# Patient Record
Sex: Male | Born: 1980 | Race: Black or African American | Hispanic: No | Marital: Single
Health system: Southern US, Community
[De-identification: ages and names within clinical notes are randomized; demographics above are authoritative.]

---

## 2000-11-01 ENCOUNTER — Emergency Department (HOSPITAL_COMMUNITY): Admission: EM | Admit: 2000-11-01 | Discharge: 2000-11-01 | Payer: Self-pay | Admitting: Emergency Medicine

## 2008-08-06 ENCOUNTER — Emergency Department (HOSPITAL_COMMUNITY): Admission: EM | Admit: 2008-08-06 | Discharge: 2008-08-06 | Payer: Self-pay | Admitting: Emergency Medicine

## 2010-04-23 IMAGING — CR DG CHEST 1V PORT
1 series · 1 of 1 positions shown · non-contrast
Comparison: None

CLINICAL DATA: Shortness of breath.

PORTABLE CHEST - 1 VIEW

[view not recorded]
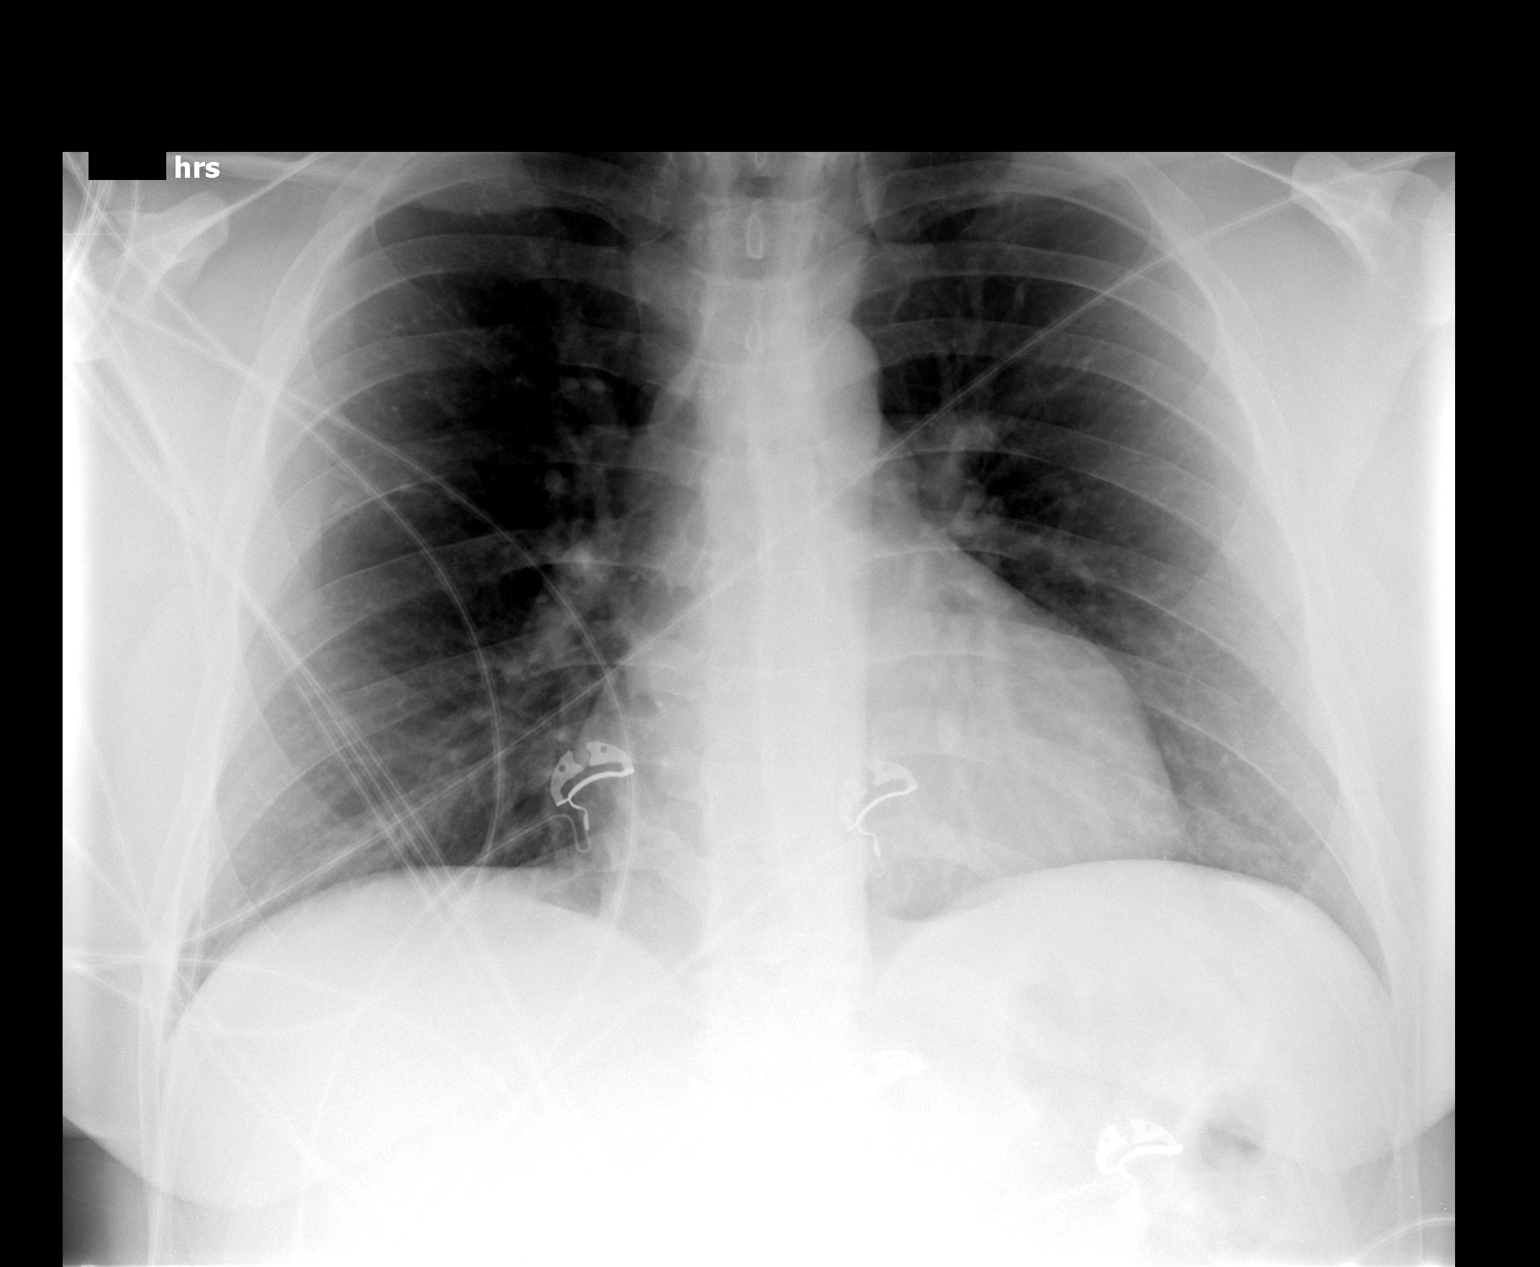

[1 of 1 positions shown; findings below may reference images not displayed]

FINDINGS: The heart size and vascularity are normal and the lungs
are clear.  No bony abnormality.
IMPRESSION: Normal chest.

## 2010-10-02 LAB — DIFFERENTIAL
Eosinophils Absolute: 0 10*3/uL (ref 0.0–0.7)
Eosinophils Relative: 0 % (ref 0–5)
Lymphocytes Relative: 19 % (ref 12–46)
Lymphs Abs: 1.1 10*3/uL (ref 0.7–4.0)
Monocytes Relative: 8 % (ref 3–12)

## 2010-10-02 LAB — POCT CARDIAC MARKERS
CKMB, poc: <1 ng/mL — ABNORMAL LOW (ref 1.0–8.0)
CKMB, poc: <1 ng/mL — ABNORMAL LOW (ref 1.0–8.0)
Troponin i, poc: <0.05 ng/mL (ref 0.00–0.09)
Troponin i, poc: <0.05 ng/mL (ref 0.00–0.09)

## 2010-10-02 LAB — CBC
MCHC: 33.7 g/dL (ref 30.0–36.0)
MCV: 86.3 fL (ref 78.0–100.0)
Platelets: 227 10*3/uL (ref 150–400)
RDW: 13.6 % (ref 11.5–15.5)

## 2010-10-02 LAB — COMPREHENSIVE METABOLIC PANEL
ALT: 54 U/L — ABNORMAL HIGH (ref 0–53)
AST: 29 U/L (ref 0–37)
Calcium: 9.1 mg/dL (ref 8.4–10.5)
Creatinine, Ser: 1.12 mg/dL (ref 0.4–1.5)
GFR calc Af Amer: >60 mL/min (ref >60)
Sodium: 140 mEq/L (ref 135–145)
Total Protein: 7.3 g/dL (ref 6.0–8.3)

## 2010-10-02 LAB — D-DIMER, QUANTITATIVE: D-Dimer, Quant: 0.24 ug/mL-FEU (ref 0.00–0.48)

## 2017-03-18 DIAGNOSIS — R509 Fever, unspecified: Secondary | ICD-10-CM | POA: Diagnosis not present

## 2017-04-02 DIAGNOSIS — Z23 Encounter for immunization: Secondary | ICD-10-CM | POA: Diagnosis not present

## 2019-07-01 DIAGNOSIS — Z20828 Contact with and (suspected) exposure to other viral communicable diseases: Secondary | ICD-10-CM | POA: Diagnosis not present

## 2019-10-13 DIAGNOSIS — Z0000 Encounter for general adult medical examination without abnormal findings: Secondary | ICD-10-CM | POA: Diagnosis not present

## 2019-10-13 DIAGNOSIS — Z13220 Encounter for screening for lipoid disorders: Secondary | ICD-10-CM | POA: Diagnosis not present

## 2019-10-13 DIAGNOSIS — Z800 Family history of malignant neoplasm of digestive organs: Secondary | ICD-10-CM | POA: Diagnosis not present

## 2019-10-13 DIAGNOSIS — Z131 Encounter for screening for diabetes mellitus: Secondary | ICD-10-CM | POA: Diagnosis not present

## 2019-11-18 DIAGNOSIS — Z01818 Encounter for other preprocedural examination: Secondary | ICD-10-CM | POA: Diagnosis not present

## 2019-11-18 DIAGNOSIS — Z1211 Encounter for screening for malignant neoplasm of colon: Secondary | ICD-10-CM | POA: Diagnosis not present

## 2019-11-18 DIAGNOSIS — Z800 Family history of malignant neoplasm of digestive organs: Secondary | ICD-10-CM | POA: Diagnosis not present

## 2019-12-22 DIAGNOSIS — Z1159 Encounter for screening for other viral diseases: Secondary | ICD-10-CM | POA: Diagnosis not present

## 2019-12-27 DIAGNOSIS — Q438 Other specified congenital malformations of intestine: Secondary | ICD-10-CM | POA: Diagnosis not present

## 2019-12-27 DIAGNOSIS — K648 Other hemorrhoids: Secondary | ICD-10-CM | POA: Diagnosis not present

## 2019-12-27 DIAGNOSIS — K529 Noninfective gastroenteritis and colitis, unspecified: Secondary | ICD-10-CM | POA: Diagnosis not present

## 2019-12-27 DIAGNOSIS — Z1211 Encounter for screening for malignant neoplasm of colon: Secondary | ICD-10-CM | POA: Diagnosis not present

## 2019-12-27 DIAGNOSIS — Z800 Family history of malignant neoplasm of digestive organs: Secondary | ICD-10-CM | POA: Diagnosis not present

## 2019-12-27 DIAGNOSIS — K6389 Other specified diseases of intestine: Secondary | ICD-10-CM | POA: Diagnosis not present

## 2019-12-29 DIAGNOSIS — F418 Other specified anxiety disorders: Secondary | ICD-10-CM | POA: Diagnosis not present

## 2022-03-05 ENCOUNTER — Emergency Department (HOSPITAL_BASED_OUTPATIENT_CLINIC_OR_DEPARTMENT_OTHER)
Admission: EM | Admit: 2022-03-05 | Discharge: 2022-03-05 | Disposition: A | Discharge status: Home / Self Care | Payer: PRIVATE HEALTH INSURANCE | Attending: Emergency Medicine | Admitting: Emergency Medicine

## 2022-03-05 ENCOUNTER — Other Ambulatory Visit (HOSPITAL_BASED_OUTPATIENT_CLINIC_OR_DEPARTMENT_OTHER): Payer: Self-pay

## 2022-03-05 ENCOUNTER — Other Ambulatory Visit: Payer: Self-pay

## 2022-03-05 ENCOUNTER — Encounter (HOSPITAL_BASED_OUTPATIENT_CLINIC_OR_DEPARTMENT_OTHER): Payer: Self-pay

## 2022-03-05 DIAGNOSIS — W57XXXA Bitten or stung by nonvenomous insect and other nonvenomous arthropods, initial encounter: Secondary | ICD-10-CM | POA: Insufficient documentation

## 2022-03-05 DIAGNOSIS — T783XXA Angioneurotic edema, initial encounter: Secondary | ICD-10-CM | POA: Diagnosis present

## 2022-03-05 DIAGNOSIS — T63461A Toxic effect of venom of wasps, accidental (unintentional), initial encounter: Secondary | ICD-10-CM

## 2022-03-05 MED ORDER — EPINEPHRINE 0.3 MG/0.3ML IJ SOAJ
0.3000 mg | INTRAMUSCULAR | 0 refills | Status: AC | PRN
  Filled 2022-03-05 (×2): qty 2, 30d supply, fill #0

## 2022-03-05 MED ORDER — PREDNISONE 20 MG PO TABS
40.0000 mg | ORAL_TABLET | Freq: Every day | ORAL | 0 refills | Status: AC
  Filled 2022-03-05: qty 6, 3d supply, fill #0

## 2022-03-05 MED ORDER — DEXAMETHASONE SODIUM PHOSPHATE 10 MG/ML IJ SOLN
10.0000 mg | Freq: Once | INTRAMUSCULAR | Status: AC
  Administered 2022-03-05: 10 mg via INTRAVENOUS
  Filled 2022-03-05: qty 1

## 2022-03-05 MED ORDER — FAMOTIDINE IN NACL 20-0.9 MG/50ML-% IV SOLN
20.0000 mg | Freq: Once | INTRAVENOUS | Status: AC
  Administered 2022-03-05: 20 mg via INTRAVENOUS
  Filled 2022-03-05: qty 50

## 2022-03-05 NOTE — ED Provider Notes (Addendum)
Here with allergic reaction.  Signed out to me at 7 AM.  Bit by a yellow jacket on his nose and left jaw yesterday afternoon.  He is had swelling to his face around his eyes, left cheek.  No breathing issues or swelling of his tongue or lips.  Has been stung multiple times in the past with similar reactions but never anaphylactic reaction.  Appears very comfortable.  He has been given Decadron and famotidine with improvement.  Clear breath sounds.  Well-appearing.  Still some swelling around the eyes and the left side of his face but seems to be improving.  Despite no history of anaphylaxis did have shared decision with him about prescribing him an EpiPen and when and how to use them.  We will continue prednisone outpatient as well.  Understands return precautions.  Discharged in good condition.  Bee sting happened over 15 hours ago and unlikely that he needs any further acute treatment at this time.  He understands return precautions.  This chart was dictated using voice recognition software.  Despite best efforts to proofread,  errors can occur which can change the documentation meaning.    Lennice Sites, DO 03/05/22 [place], [place], DO 03/05/22 [phone]

## 2022-03-05 NOTE — ED Provider Notes (Signed)
[facility] Provider Note: Georgena Spurling, MD, FACEP  CSN: [id number] MRN: [id number] ARRIVAL: 03/05/22 at 0613 ROOM: DB012/DB012   CHIEF COMPLAINT  Facial Swelling   HISTORY OF PRESENT ILLNESS  03/05/22 6:23 AM Larry Sanchez is a 41 y.o. male who was stung on the face and neck by yellow jackets yesterday afternoon about 3:30 PM.  He had some localized discomfort at the sting sites yesterday but no significant swelling.  This morning about 1 AM he awakened with significant swelling of his face and the left side of his neck.  It is not painful.  It is not pruritic.  He is having no swelling of the tongue or throat.  He is having no difficulty breathing, speaking or swallowing.  He is having no nausea, vomiting or diarrhea.  He took 50 mg of Benadryl about 90 minutes prior to arrival.  He is not on any medications on a regular basis.   History reviewed. No pertinent past medical history.  History reviewed. No pertinent surgical history.  No family history on file.     Prior to Admission medications   Medication Sig Start Date End Date Taking? Authorizing Provider  EPINEPHrine 0.3 mg/0.3 mL IJ SOAJ injection Inject 0.3 mg into the muscle as needed for anaphylaxis. 03/05/22  Yes Curatolo, Adam, DO  predniSONE (DELTASONE) 20 MG tablet Take 2 tablets (40 mg total) by mouth daily for 3 days. 03/05/22 03/08/22 Yes Curatolo, Adam, DO    Allergies Bee venom   REVIEW OF SYSTEMS  Negative except as noted here or in the History of Present Illness.   PHYSICAL EXAMINATION  Initial Vital Signs Blood pressure 115/89, pulse 71, temperature 98.2 F (36.8 C), temperature source Oral, resp. rate 20, height 5\' 9"  (1.753 m), weight 99.8 kg, SpO2 98 %.  Examination General: Well-developed, well-nourished male in no acute distress; appearance consistent with age of record HENT: normocephalic; edema of the face and left side of the neck without involvement of the tongue or  pharynx:    Eyes: pupils equal, round and reactive to light; extraocular muscles intact Neck: supple Heart: regular rate and rhythm Lungs: clear to auscultation bilaterally Abdomen: soft; nondistended; nontender; bowel sounds present Extremities: No deformity; full range of motion Neurologic: Awake, alert and oriented; motor function intact in all extremities and symmetric; no facial droop Skin: Warm and dry; no rash Psychiatric: Normal mood and affect   RESULTS  Summary of this visit's results, reviewed and interpreted by myself:   EKG Interpretation  Date/Time:    Ventricular Rate:    PR Interval:    QRS Duration:   QT Interval:    QTC Calculation:   R Axis:     Text Interpretation:         Laboratory Studies: No results found for this or any previous visit (from the past 24 hour(s)). Imaging Studies: No results found.  ED COURSE and MDM  Nursing notes, initial and subsequent vitals signs, including pulse oximetry, reviewed and interpreted by myself.  Vitals:   03/05/22 0619 03/05/22 0700 03/05/22 0715 03/05/22 0730  BP: 115/89 122/85 (!) 135/92 116/85  Pulse: 71 61 81 64  Resp: 20  18 18   Temp: 98.2 F (36.8 C)     TempSrc: Oral     SpO2: 98% 99% 100% 98%  Weight:      Height:       Medications  famotidine (PEPCID) IVPB 20 mg premix (20 mg Intravenous New Bag/Given 03/05/22 0627)  dexamethasone (DECADRON) injection 10 mg (10 mg Intravenous Given 03/05/22 6226)   It is unusual for patient to have an allergic reaction to hymenoptera sting over 12 hours after the event but there are documented cases of delayed swelling as long as a week after the inciting event.  He already has taken 50 mg of Benadryl so we have given Pepcid and dexamethasone to supplement this.  He is having no difficulty breathing or airway compromise and he is not in anaphylactic shock so epinephrine is not indicated at this time.  7:00 AM Signed out to Dr. Lockie Mola.  PROCEDURES   Procedures   ED DIAGNOSES     ICD-10-CM   1. Wasp sting, accidental or unintentional, initial encounter  T63.461A     2. Allergic angioedema, initial encounter  T78.Vernia Buff, MD 03/05/22 1144

## 2022-03-05 NOTE — ED Triage Notes (Addendum)
Pt states that he was stung by a yellow jacket yesterday afternoon, now has facial swelling, denies itching or SOB

## 2022-03-05 NOTE — Discharge Instructions (Addendum)
Continue 25 mg of Benadryl every 6-8 hours.  Take next dose of prednisone tomorrow morning.  Please return if symptoms worsen.  Consider buying EpiPen to use in case you develop anaphylactic reaction to bee sting in the future.  I have provided information about that in your instructions.
